# Patient Record
Sex: Male | Born: 1980 | Race: White | Hispanic: No | Marital: Single | Smoking: Never smoker
Health system: Southern US, Community
[De-identification: ages and names within clinical notes are randomized; demographics above are authoritative.]

---

## 2013-03-18 ENCOUNTER — Other Ambulatory Visit (HOSPITAL_COMMUNITY): Payer: Self-pay | Admitting: Chiropractor

## 2013-03-18 DIAGNOSIS — G540 Brachial plexus disorders: Secondary | ICD-10-CM

## 2013-03-20 ENCOUNTER — Other Ambulatory Visit: Payer: Self-pay | Admitting: Orthopedic Surgery

## 2013-03-20 ENCOUNTER — Other Ambulatory Visit (HOSPITAL_COMMUNITY): Payer: Self-pay | Admitting: Orthopedic Surgery

## 2013-03-20 DIAGNOSIS — M25512 Pain in left shoulder: Secondary | ICD-10-CM

## 2013-03-23 ENCOUNTER — Ambulatory Visit (HOSPITAL_COMMUNITY): Admission: RE | Admit: 2013-03-23 | Payer: Self-pay | Source: Ambulatory Visit

## 2013-03-29 ENCOUNTER — Other Ambulatory Visit: Payer: Self-pay

## 2013-04-04 ENCOUNTER — Ambulatory Visit
Admission: RE | Admit: 2013-04-04 | Discharge: 2013-04-04 | Disposition: A | Discharge status: Home / Self Care | Payer: BC Managed Care – PPO | Source: Ambulatory Visit | Attending: Orthopedic Surgery | Admitting: Orthopedic Surgery

## 2013-04-04 DIAGNOSIS — M25512 Pain in left shoulder: Secondary | ICD-10-CM

## 2016-04-30 ENCOUNTER — Other Ambulatory Visit: Payer: Self-pay | Admitting: Family Medicine

## 2016-04-30 ENCOUNTER — Ambulatory Visit
Admission: RE | Admit: 2016-04-30 | Discharge: 2016-04-30 | Disposition: A | Discharge status: Home / Self Care | Payer: No Typology Code available for payment source | Source: Ambulatory Visit | Attending: Family Medicine | Admitting: Family Medicine

## 2016-04-30 ENCOUNTER — Other Ambulatory Visit: Payer: Self-pay

## 2016-04-30 DIAGNOSIS — R1011 Right upper quadrant pain: Secondary | ICD-10-CM

## 2017-04-25 ENCOUNTER — Encounter (INDEPENDENT_AMBULATORY_CARE_PROVIDER_SITE_OTHER): Payer: Self-pay

## 2017-04-25 ENCOUNTER — Ambulatory Visit: Payer: Self-pay | Admitting: Neurology

## 2018-04-29 IMAGING — CT CT ABD-PELV W/O CM
2 of 4 series · 15 of 46 positions shown, 17 images · non-contrast
Comparison: None.

CLINICAL DATA: Acute onset of right flank pain and microhematuria.
Initial encounter.

EXAM:
CT ABDOMEN AND PELVIS WITHOUT CONTRAST
TECHNIQUE: Multidetector CT imaging of the abdomen and pelvis was performed
following the standard protocol without IV contrast.

[Series 2: renal standard/full · axial · 0.79mm/px · z∈[+756,+1261]mm · 12 of 116 slices shown, 14 images]
[im 10/116  soft-tissue]
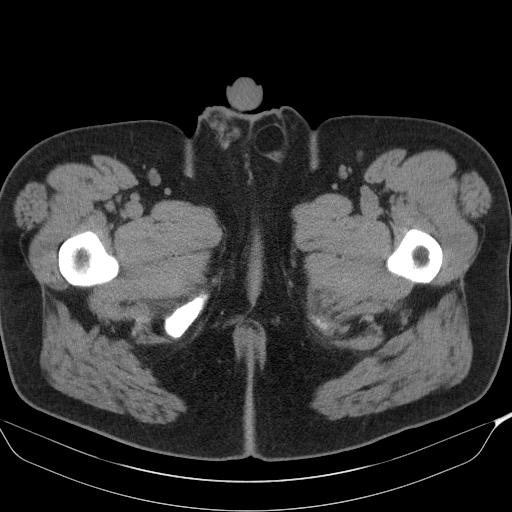
[im 10/116  bone]
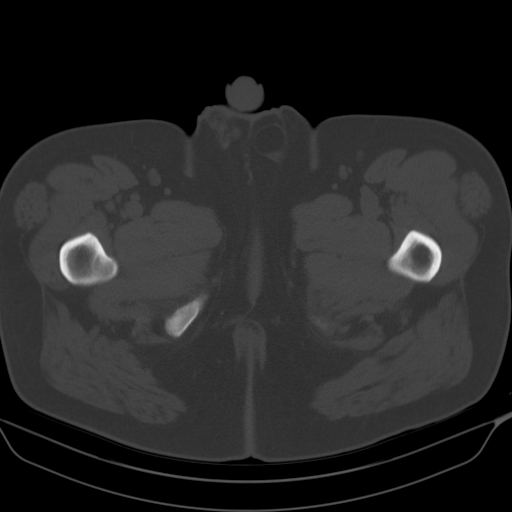
[im 19/116  soft-tissue]
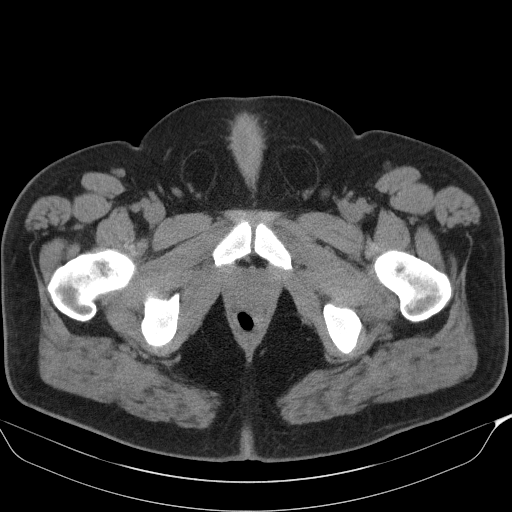
[im 28/116  soft-tissue]
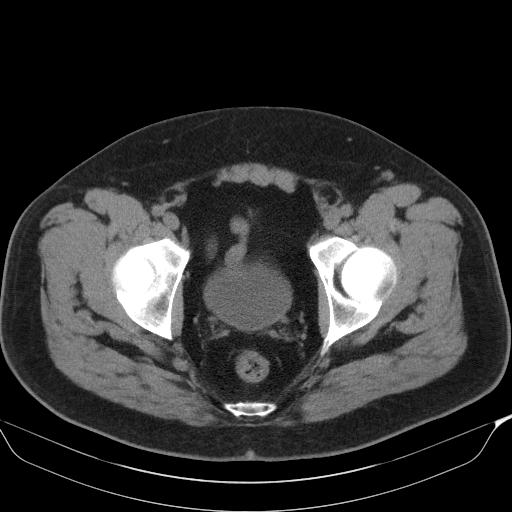
[im 37/116  soft-tissue]
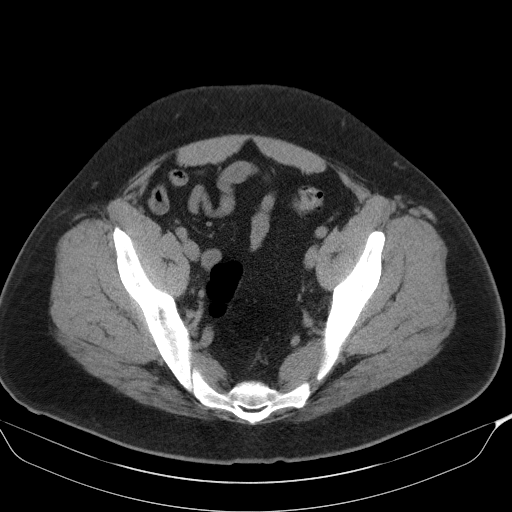
[im 47/116  soft-tissue]
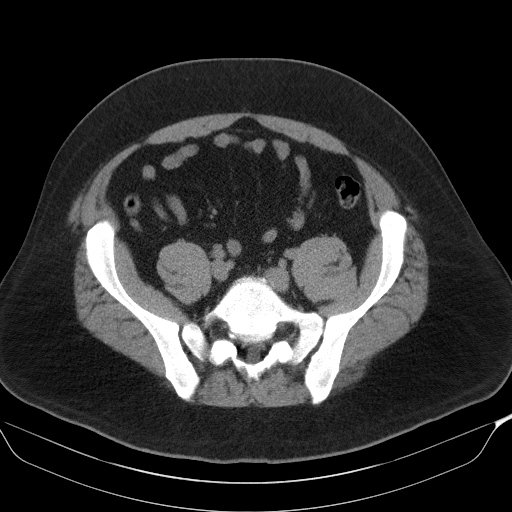
[im 56/116  soft-tissue]
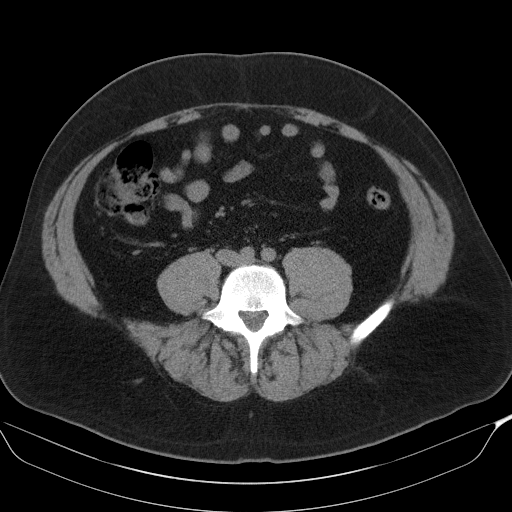
[im 65/116  soft-tissue]
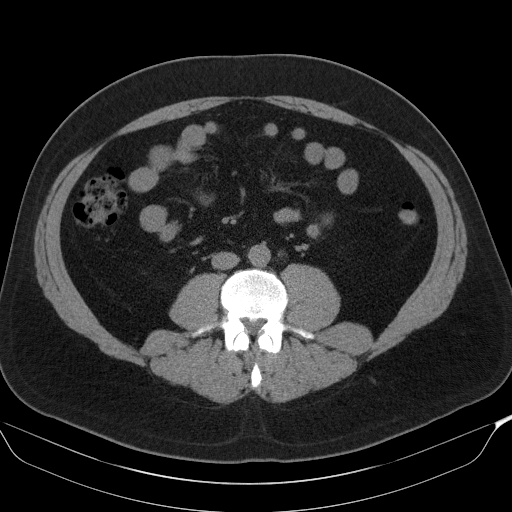
[im 74/116  soft-tissue]
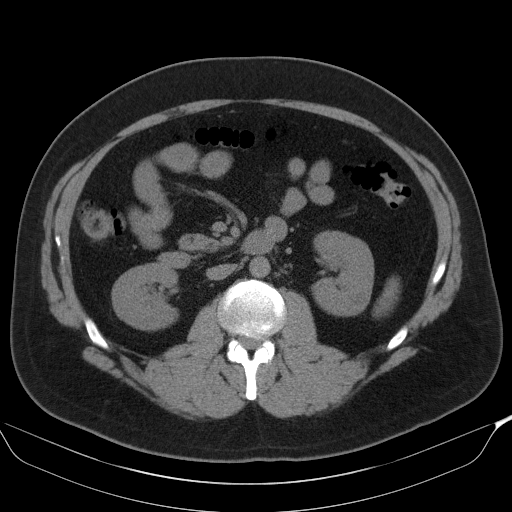
[im 83/116  soft-tissue]
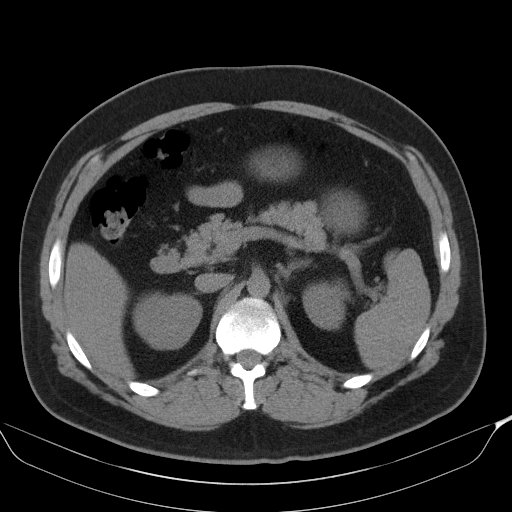
[im 83/116  bone]
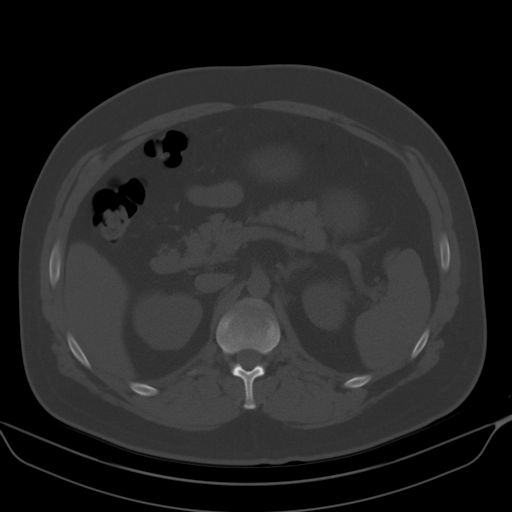
[im 93/116  soft-tissue]
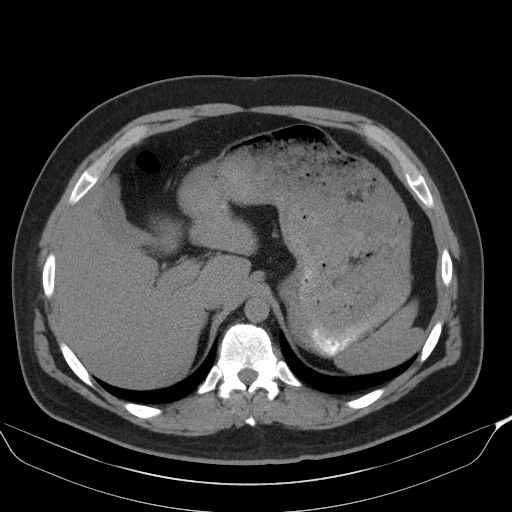
[im 102/116  soft-tissue]
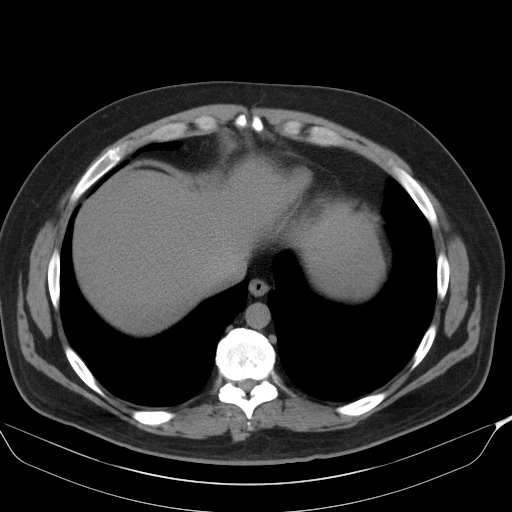
[im 111/116  soft-tissue]
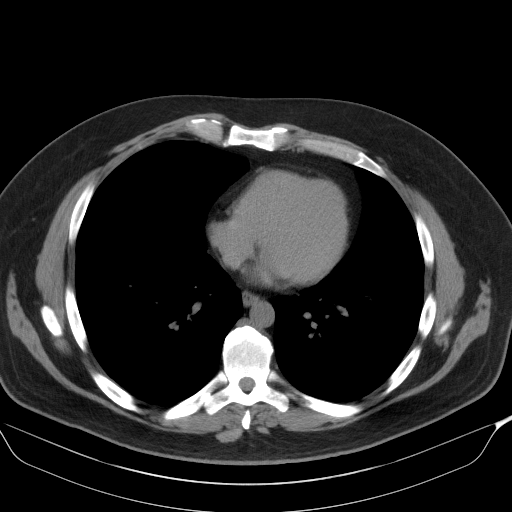

[Series 3: cor · coronal · 0.73mm/px · 3 of 104 slices shown]
[im 35/104  soft-tissue]
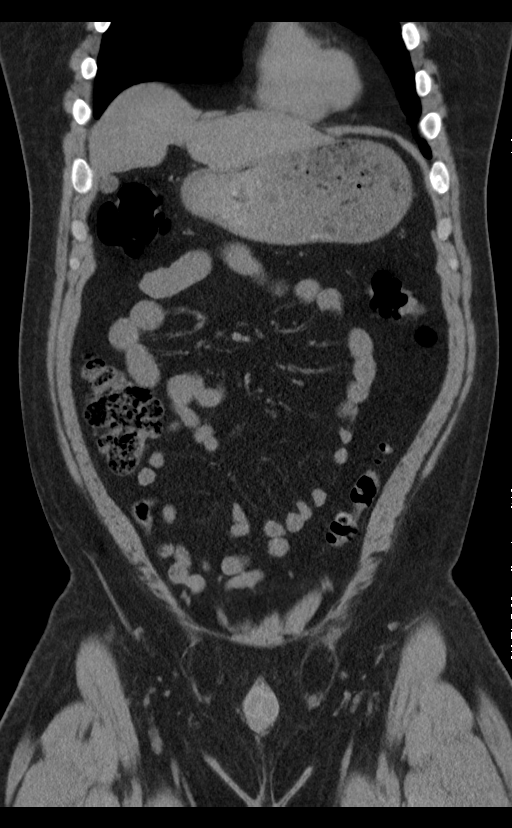
[im 46/104  soft-tissue]
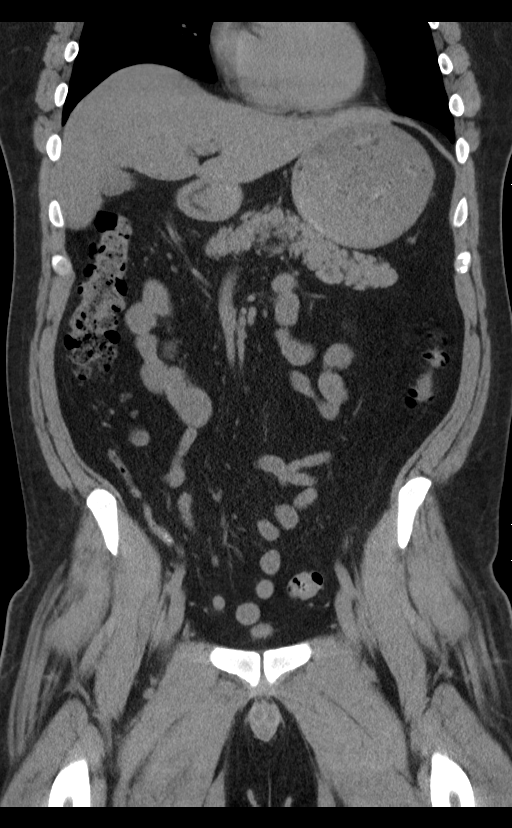
[im 58/104  soft-tissue]
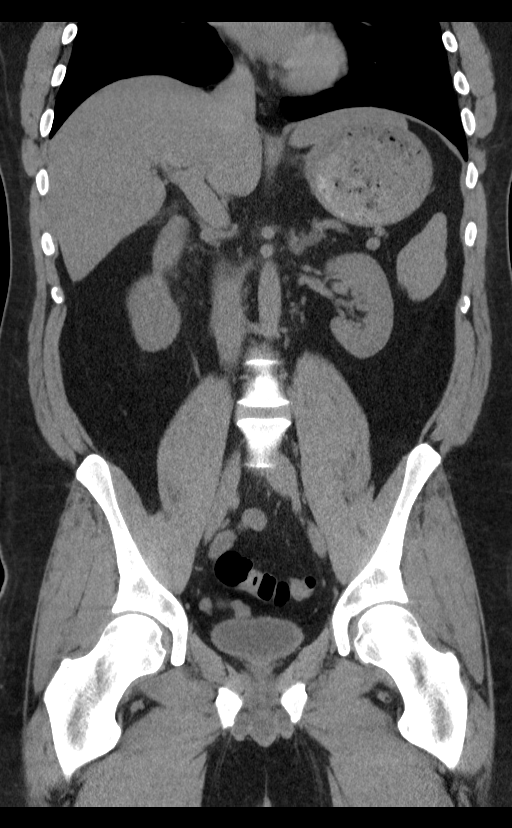

[15 of 46 positions shown; findings below may reference images not displayed]

FINDINGS: Lower chest: The visualized lung bases are grossly clear. The
visualized portions of the mediastinum are unremarkable.

Hepatobiliary: The liver is unremarkable in appearance. The
gallbladder is unremarkable in appearance. The common bile duct
remains normal in caliber.

Pancreas: The pancreas is within normal limits.

Spleen: The spleen is unremarkable in appearance.

Adrenals/Urinary Tract: The adrenal glands are unremarkable in
appearance.

Small left renal cysts are noted. The kidneys are otherwise
unremarkable. There is no evidence of hydronephrosis. No renal or
ureteral stones are identified. No perinephric stranding is seen.

Stomach/Bowel: The stomach is unremarkable in appearance. The small
bowel is within normal limits. The appendix is normal in caliber,
without evidence of appendicitis. The colon is unremarkable in
appearance.

Vascular/Lymphatic: The abdominal aorta is unremarkable in
appearance. The inferior vena cava is grossly unremarkable. No
retroperitoneal lymphadenopathy is seen. No pelvic sidewall
lymphadenopathy is identified.

Reproductive: The bladder is mildly distended and grossly
unremarkable. The prostate remains normal in size. Minimal
calcification is seen within the prostate.

Other: No additional soft tissue abnormalities are seen.

Musculoskeletal: No acute osseous abnormalities are identified. The
visualized musculature is unremarkable in appearance.
IMPRESSION: 1. No acute abnormality seen within the abdomen or pelvis.
2. Small left renal cysts noted.

These results were called by telephone at the time of interpretation
on 04/30/2016 at [DATE] to Dr. WAI SHEUNG ANGELY, who verbally
acknowledged these results.

## 2021-06-26 ENCOUNTER — Encounter (HOSPITAL_BASED_OUTPATIENT_CLINIC_OR_DEPARTMENT_OTHER): Payer: Self-pay

## 2021-06-26 ENCOUNTER — Emergency Department (HOSPITAL_BASED_OUTPATIENT_CLINIC_OR_DEPARTMENT_OTHER)
Admission: EM | Admit: 2021-06-26 | Discharge: 2021-06-26 | Disposition: A | Discharge status: Home / Self Care | Payer: 59 | Attending: Emergency Medicine | Admitting: Emergency Medicine

## 2021-06-26 ENCOUNTER — Other Ambulatory Visit: Payer: Self-pay

## 2021-06-26 ENCOUNTER — Emergency Department (HOSPITAL_BASED_OUTPATIENT_CLINIC_OR_DEPARTMENT_OTHER): Payer: 59

## 2021-06-26 DIAGNOSIS — R109 Unspecified abdominal pain: Secondary | ICD-10-CM | POA: Diagnosis present

## 2021-06-26 DIAGNOSIS — N201 Calculus of ureter: Secondary | ICD-10-CM

## 2021-06-26 DIAGNOSIS — N132 Hydronephrosis with renal and ureteral calculous obstruction: Secondary | ICD-10-CM | POA: Insufficient documentation

## 2021-06-26 LAB — COMPREHENSIVE METABOLIC PANEL
ALT: 27 U/L (ref 0–44)
AST: 18 U/L (ref 15–41)
Albumin: 4.5 g/dL (ref 3.5–5.0)
Alkaline Phosphatase: 66 U/L (ref 38–126)
Anion gap: 8 (ref 5–15)
BUN: 9 mg/dL (ref 6–20)
CO2: 28 mmol/L (ref 22–32)
Calcium: 9.6 mg/dL (ref 8.9–10.3)
Chloride: 103 mmol/L (ref 98–111)
Creatinine, Ser: 1.18 mg/dL (ref 0.61–1.24)
GFR, Estimated: >60 mL/min (ref >60)
Glucose, Bld: 147 mg/dL — ABNORMAL HIGH (ref 70–99)
Potassium: 4.4 mmol/L (ref 3.5–5.1)
Sodium: 139 mmol/L (ref 135–145)
Total Bilirubin: 0.4 mg/dL (ref 0.3–1.2)
Total Protein: 7.7 g/dL (ref 6.5–8.1)

## 2021-06-26 LAB — URINALYSIS, ROUTINE W REFLEX MICROSCOPIC
Bilirubin Urine: NEGATIVE
Glucose, UA: NEGATIVE mg/dL
Ketones, ur: NEGATIVE mg/dL
Leukocytes,Ua: NEGATIVE
Nitrite: NEGATIVE
Protein, ur: 30 mg/dL — AB
Specific Gravity, Urine: 1.027 (ref 1.005–1.030)
pH: 5.5 (ref 5.0–8.0)

## 2021-06-26 LAB — CBC
HCT: 49.4 % (ref 39.0–52.0)
Hemoglobin: 16.8 g/dL (ref 13.0–17.0)
MCH: 28.2 pg (ref 26.0–34.0)
MCHC: 34 g/dL (ref 30.0–36.0)
MCV: 83 fL (ref 80.0–100.0)
Platelets: 312 10*3/uL (ref 150–400)
RBC: 5.95 MIL/uL — ABNORMAL HIGH (ref 4.22–5.81)
RDW: 13.3 % (ref 11.5–15.5)
WBC: 12.5 10*3/uL — ABNORMAL HIGH (ref 4.0–10.5)
nRBC: 0 % (ref 0.0–0.2)

## 2021-06-26 LAB — LIPASE, BLOOD: Lipase: 40 U/L (ref 11–51)

## 2021-06-26 MED ORDER — OXYCODONE-ACETAMINOPHEN 5-325 MG PO TABS: 1.0000 | ORAL_TABLET | Freq: Four times a day (QID) | ORAL | 0 refills | Status: AC | PRN

## 2021-06-26 MED ORDER — SENNOSIDES-DOCUSATE SODIUM 8.6-50 MG PO TABS
1.0000 | ORAL_TABLET | Freq: Every evening | ORAL | 0 refills | Status: AC | PRN
Start: 2021-06-26 — End: ?

## 2021-06-26 MED ORDER — ONDANSETRON 4 MG PO TBDP: 4.0000 mg | ORAL_TABLET | Freq: Three times a day (TID) | ORAL | 0 refills | Status: AC | PRN

## 2021-06-26 MED ORDER — ONDANSETRON HCL 4 MG/2ML IJ SOLN
4.0000 mg | Freq: Once | INTRAMUSCULAR | Status: AC
  Administered 2021-06-26: 4 mg via INTRAVENOUS
  Filled 2021-06-26: qty 2

## 2021-06-26 MED ORDER — MORPHINE SULFATE (PF) 4 MG/ML IV SOLN
4.0000 mg | Freq: Once | INTRAVENOUS | Status: AC
  Administered 2021-06-26: 4 mg via INTRAVENOUS
  Filled 2021-06-26: qty 1

## 2021-06-26 MED ORDER — KETOROLAC TROMETHAMINE 30 MG/ML IJ SOLN
30.0000 mg | Freq: Once | INTRAMUSCULAR | Status: AC
  Administered 2021-06-26: 30 mg via INTRAVENOUS
  Filled 2021-06-26: qty 1

## 2021-06-26 MED ORDER — IBUPROFEN 800 MG PO TABS: 800.0000 mg | ORAL_TABLET | Freq: Three times a day (TID) | ORAL | 0 refills | Status: AC | PRN

## 2021-06-26 MED ORDER — TAMSULOSIN HCL 0.4 MG PO CAPS: 0.4000 mg | ORAL_CAPSULE | Freq: Every day | ORAL | 0 refills | Status: AC

## 2021-06-26 NOTE — ED Notes (Signed)
Lab to add culture on urine sample,

## 2021-06-26 NOTE — ED Triage Notes (Signed)
Patient here POV from Home with ABD Pain.  Patient states Pain began as Right Flank Pain and has now since transitioned into Mid ABD Pain.  Difficulty Urinating at Times. No Fevers. No N/V/D.  Patient uncomfortable during Triage. A&Ox4. GCS 15. Ambulatory.

## 2021-06-26 NOTE — ED Provider Notes (Signed)
Emergency Department Provider Note   I have reviewed the triage vital signs and the nursing notes.   HISTORY  Chief Complaint Abdominal Pain   HPI Brian Patton is a 41 y.o. male with prior history of kidney stone presents to the emergency department with left flank and mid abdominal pain.  Patient has noticed blood in the urine and states the pain feels very similar to his prior kidney stone.  With his prior stone, he did not require urology evaluation or treatment.  Symptoms began yesterday.  He denies any fevers or chills.  No nausea, vomiting, diarrhea.  He does have some difficulty with urination which was also the case with his prior stone.  No other modifying factors.  Pain is severe.   History reviewed. No pertinent past medical history.  Review of Systems  Constitutional: No fever/chills Eyes: No visual changes. ENT: No sore throat. Cardiovascular: Denies chest pain. Respiratory: Denies shortness of breath. Gastrointestinal: Positive flank and left mid-abdominal pain.  No nausea, no vomiting.  No diarrhea.  No constipation. Genitourinary: Negative for dysuria. Difficulty with urination and hematuria noted by patient.  Musculoskeletal: Negative for back pain. Skin: Negative for rash. Neurological: Negative for headaches, focal weakness or numbness.  10-point ROS otherwise negative.  ____________________________________________   PHYSICAL EXAM:  VITAL SIGNS: ED Triage Vitals  Enc Vitals Group     BP 06/26/21 1146 (!) 144/89     Pulse Rate 06/26/21 1146 94     Resp 06/26/21 1146 18     Temp 06/26/21 1146 98.1 F (36.7 C)     Temp src --      SpO2 06/26/21 1146 99 %     Weight 06/26/21 1147 242 lb (109.8 kg)     Height 06/26/21 1147 6\' 3"  (1.905 m)    Constitutional: Alert and oriented. Appears uncomfortable with frequent shifting in bed.  Eyes: Conjunctivae are normal.  Head: Atraumatic. Nose: No congestion/rhinnorhea. Mouth/Throat: Mucous  membranes are moist.  Neck: No stridor.  Cardiovascular: Normal rate, regular rhythm. Good peripheral circulation. Grossly normal heart sounds.   Respiratory: Normal respiratory effort.  No retractions. Lungs CTAB. Gastrointestinal: Soft and nontender. No distention.  Musculoskeletal: No lower extremity tenderness nor edema. No gross deformities of extremities. Neurologic:  Normal speech and language. No gross focal neurologic deficits are appreciated.  Skin:  Skin is warm, dry and intact. No rash noted.  ____________________________________________   LABS (all labs ordered are listed, but only abnormal results are displayed)  Labs Reviewed  URINE CULTURE - Abnormal; Notable for the following components:      Result Value   Culture   (*)    Value: <10,000 COLONIES/mL INSIGNIFICANT GROWTH Performed at [facility], [place] [address] [place], [place] [address]    All other components within normal limits  COMPREHENSIVE METABOLIC PANEL - Abnormal; Notable for the following components:   Glucose, Bld 147 (*)    All other components within normal limits  CBC - Abnormal; Notable for the following components:   WBC 12.5 (*)    RBC 5.95 (*)    All other components within normal limits  URINALYSIS, ROUTINE W REFLEX MICROSCOPIC - Abnormal; Notable for the following components:   Hgb urine dipstick LARGE (*)    Protein, ur 30 (*)    All other components within normal limits  LIPASE, BLOOD   ____________________________________________  RADIOLOGY  CT renal independently evaluated. Left obstructing stone with hydro noted.   ____________________________________________   PROCEDURES  Procedure(s) performed:   Procedures  None  ____________________________________________   INITIAL IMPRESSION / ASSESSMENT AND PLAN / ED COURSE  Pertinent labs & imaging results that were available during my care of the patient were reviewed by me and considered in my medical decision making  (see chart for details).   This patient is Presenting for Evaluation of abdominal pain, which does require a range of treatment options, and is a complaint that involves a high risk of morbidity and mortality.  The Differential Diagnoses includes but is not exclusive to acute appendicitis, renal colic, testicular torsion, urinary tract infection, prostatitis,  diverticulitis, small bowel obstruction, colitis, abdominal aortic aneurysm, gastroenteritis, constipation etc.   Critical Interventions- pain mgmt and labs with UA and CT renal stone study ordered.    Reassessment after intervention: Patient more comfortable on re-evaluation. Vitals remain WNL.    I did Additional Historical Information from mother at bedside who confirms history and timeline outlined above.   I decided to review pertinent External Data, and in summary patient followed in 2020 for cervical radiculopathy in the [facility] but not since, now lower back symptoms.    Clinical Laboratory Tests Ordered, included CMP, Lipase, CBC, and UA w/ culture. No evidence of UTI. No AKI. Hematuria on UA. Will send for Cx. LFTs and bilirubin are WNL.   Radiologic Tests Ordered, included CT renal which I independently evaluated. Ureteral stone with hydro noted which correlates with patient's symptoms.   Cardiac Monitor Tracing which shows NSR   Reevaluation with update and discussion with patient. CT renal confirming stone. Patient more comfortable. No fever.   Medical Decision Making: Summary:  Patient's presentation is most consistent with ureteral stone. No evidence of large or infected stone to prompt immediate Urology consultation. Provided contact information for local Urology practice for follow up. Pain medication and flomax prescribed after review of the [place] drug database in compliance with the STOP act. Discussed strict ED return precautions.   Disposition: Discharge.    ____________________________________________  FINAL CLINICAL IMPRESSION(S) / ED DIAGNOSES  Final diagnoses:  Left ureteral stone     MEDICATIONS GIVEN DURING THIS VISIT:  Medications  morphine 4 MG/ML injection 4 mg (4 mg Intravenous Given 06/26/21 1310)  ondansetron (ZOFRAN) injection 4 mg (4 mg Intravenous Given 06/26/21 1309)  ketorolac (TORADOL) 30 MG/ML injection 30 mg (30 mg Intravenous Given 06/26/21 1309)     NEW OUTPATIENT MEDICATIONS STARTED DURING THIS VISIT:  Discharge Medication List as of 06/26/2021  1:33 PM     START taking these medications   Details  ibuprofen (ADVIL) 800 MG tablet Take 1 tablet (800 mg total) by mouth every 8 (eight) hours as needed for moderate pain., Starting Mon 06/26/2021, Normal    ondansetron (ZOFRAN-ODT) 4 MG disintegrating tablet Take 1 tablet (4 mg total) by mouth every 8 (eight) hours as needed for nausea or vomiting., Starting Mon 06/26/2021, Normal    oxyCODONE-acetaminophen (PERCOCET/ROXICET) 5-325 MG tablet Take 1 tablet by mouth every 6 (six) hours as needed for severe pain., Starting Mon 06/26/2021, Normal    senna-docusate (SENOKOT-S) 8.6-50 MG tablet Take 1 tablet by mouth at bedtime as needed for mild constipation or moderate constipation., Starting Mon 06/26/2021, Normal    tamsulosin (FLOMAX) 0.4 MG CAPS capsule Take 1 capsule (0.4 mg total) by mouth daily., Starting Mon 06/26/2021, Normal        Note:  This document was prepared using Dragon voice recognition software and may include unintentional dictation errors.  Nanda Quinton,  MD, [facility]    Callum Wolf, [facility], MD 06/28/21 1640

## 2021-06-26 NOTE — Discharge Instructions (Signed)

## 2021-06-27 LAB — URINE CULTURE: Culture: <10000 — AB

## 2023-12-21 IMAGING — CT CT RENAL STONE PROTOCOL
2 of 4 series · 15 of 46 positions shown, 17 images · non-contrast
Comparison: CT abdomen and pelvis dated April 30, 2016
COMPARISON: CT abdomen and pelvis dated April 30, 2016

Addendum:
CLINICAL DATA: Left flank pain

EXAM:
CT ABDOMEN AND PELVIS WITHOUT CONTRAST
TECHNIQUE: Multidetector CT imaging of the abdomen and pelvis was performed
following the standard protocol without IV contrast.

[Series 2: stone full · axial · 0.77mm/px · z∈[-540,-70]mm · 12 of 108 slices shown, 14 images]
[im 9/108  soft-tissue]
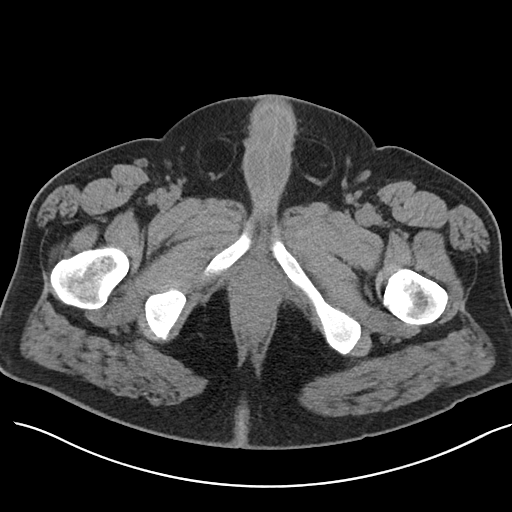
[im 9/108  bone]
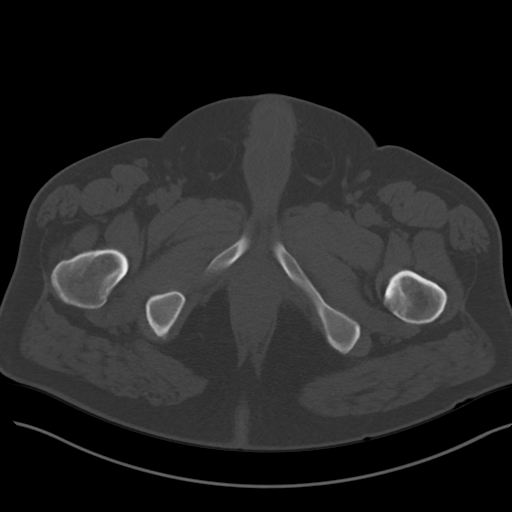
[im 18/108  soft-tissue]
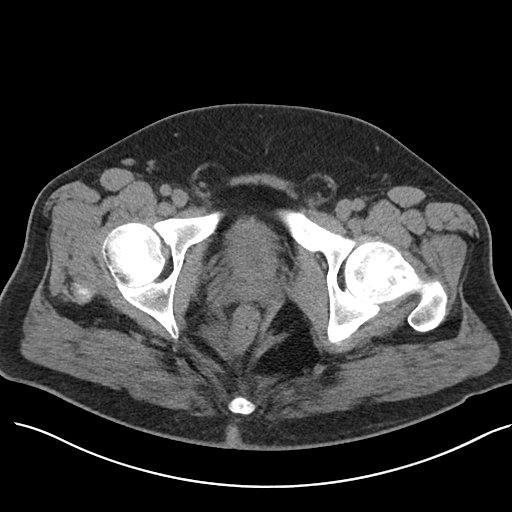
[im 26/108  soft-tissue]
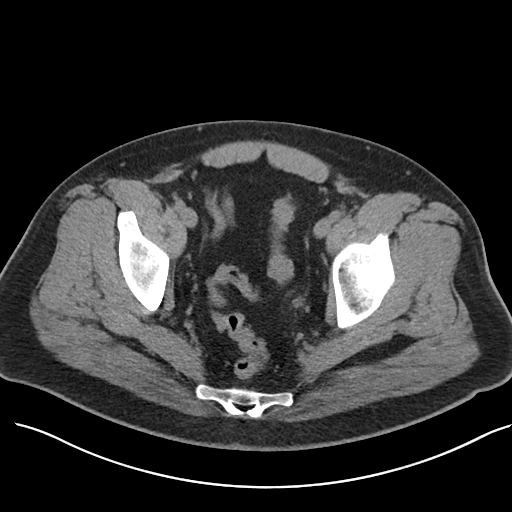
[im 35/108  soft-tissue]
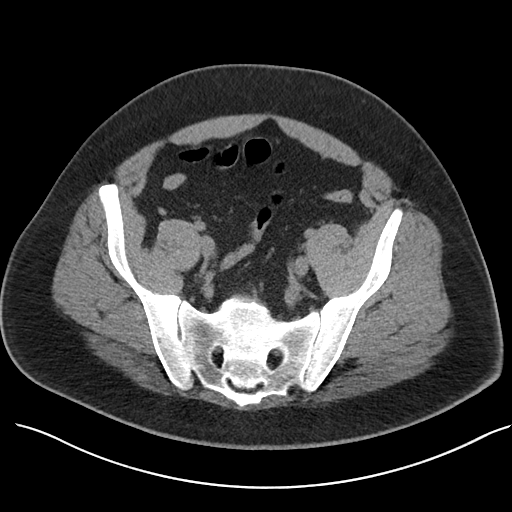
[im 43/108  soft-tissue]
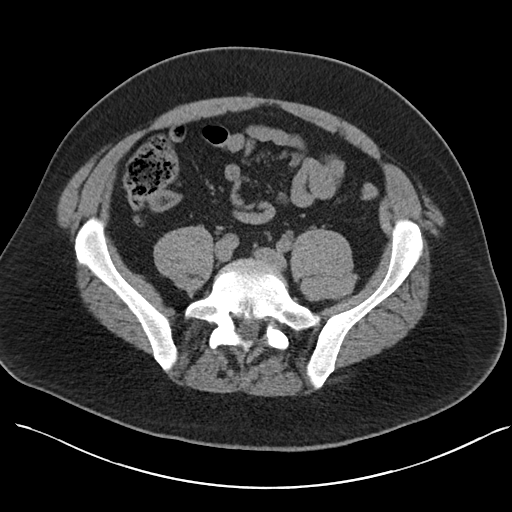
[im 52/108  soft-tissue]
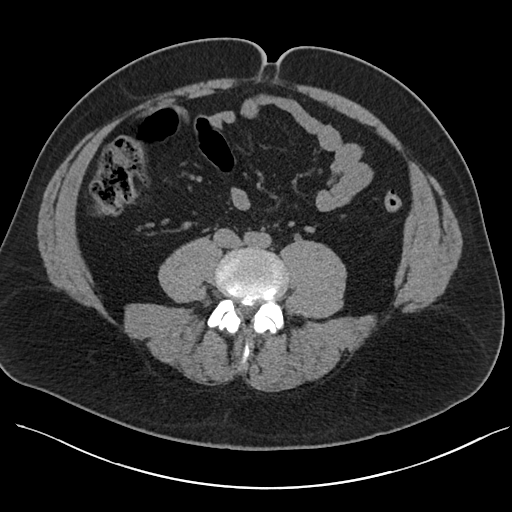
[im 60/108  soft-tissue]
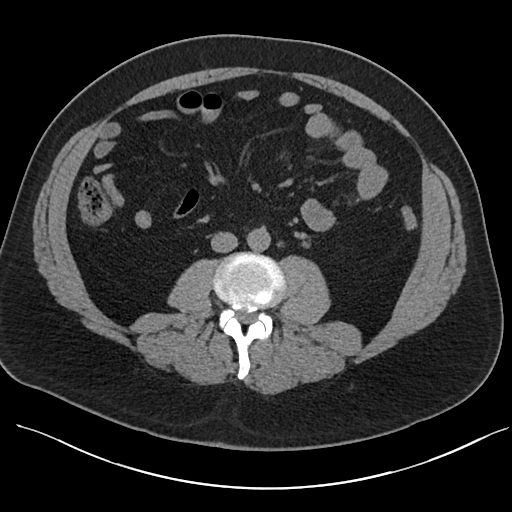
[im 69/108  soft-tissue]
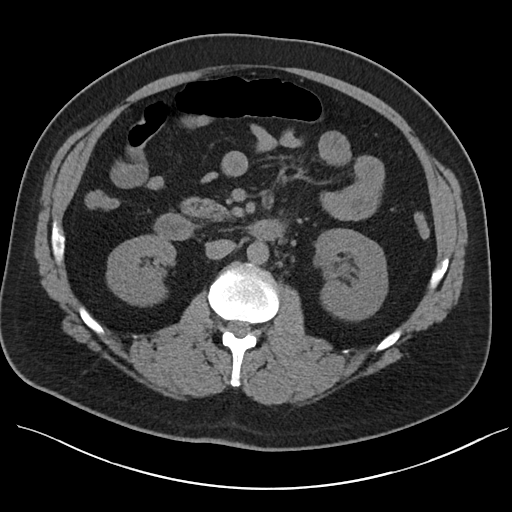
[im 78/108  soft-tissue]
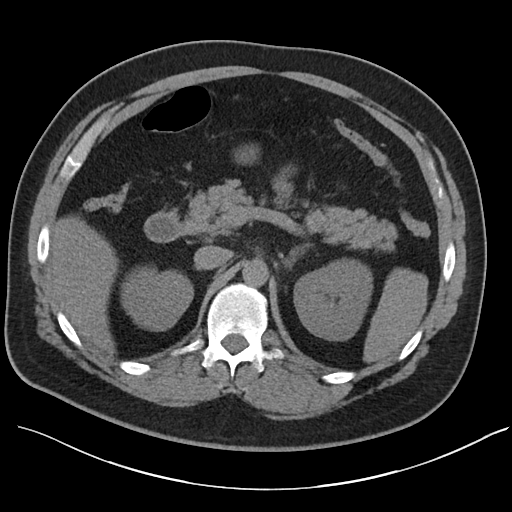
[im 78/108  bone]
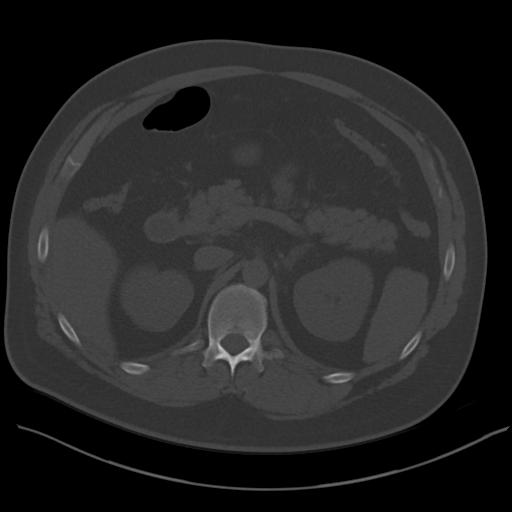
[im 86/108  soft-tissue]
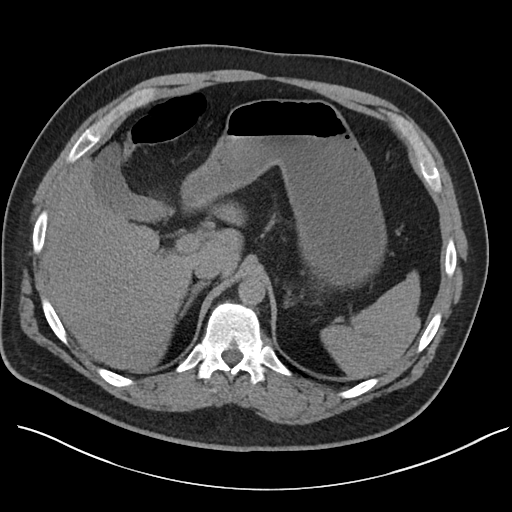
[im 95/108  soft-tissue]
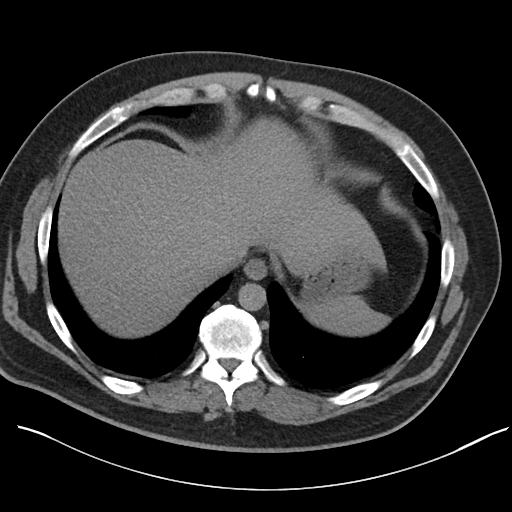
[im 103/108  soft-tissue]
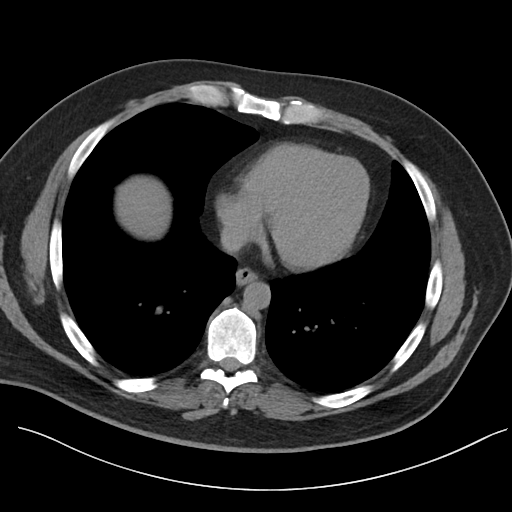

[Series 5: coronal · coronal · 0.84mm/px · 3 of 109 slices shown]
[im 37/109  soft-tissue]
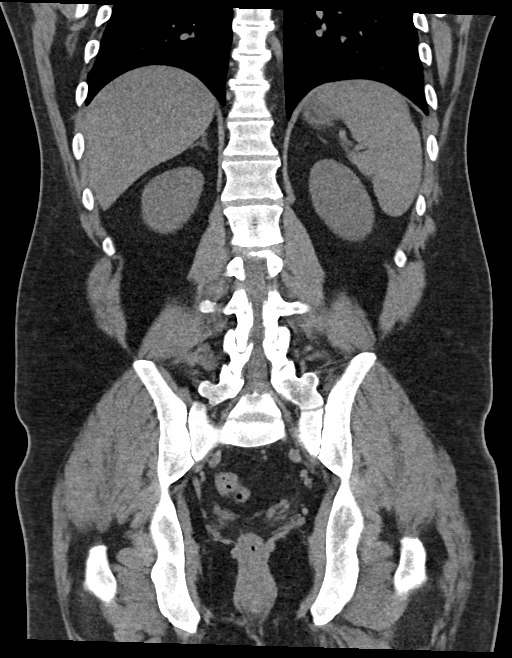
[im 49/109  soft-tissue]
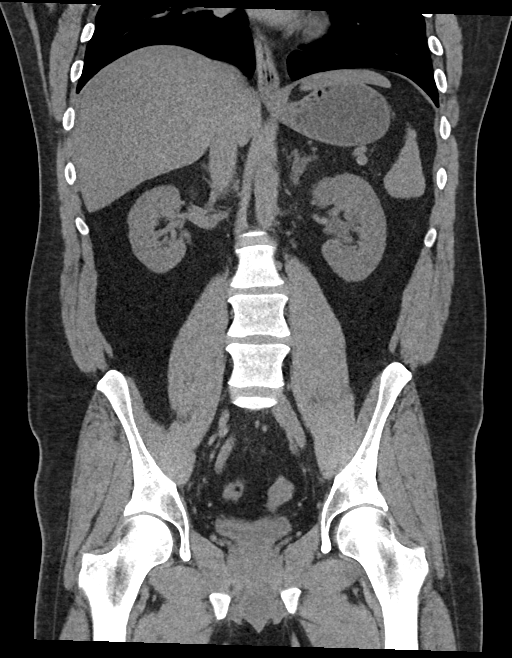
[im 61/109  soft-tissue]
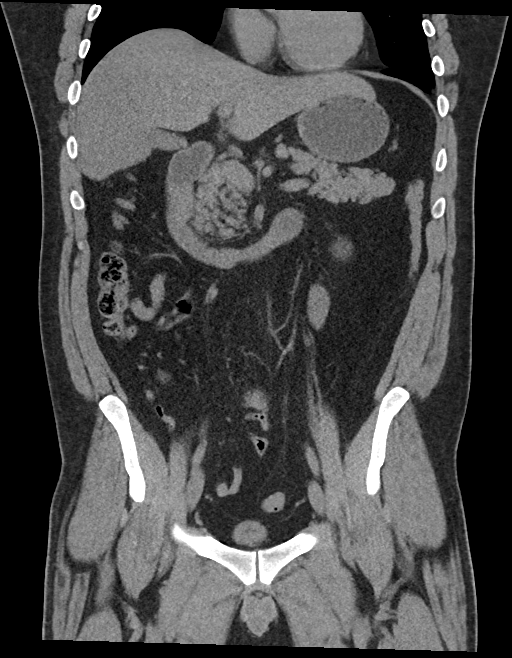

[15 of 46 positions shown; findings below may reference images not displayed]

FINDINGS: Lower chest: No acute abnormality.

Hepatobiliary: No focal liver abnormality is seen. No gallstones,
gallbladder wall thickening, or biliary dilatation.

Pancreas: Unremarkable. No pancreatic ductal dilatation or
surrounding inflammatory changes.

Spleen: Normal in size without focal abnormality.

Adrenals/Urinary Tract: No hydronephrosis or nephrolithiasis. Tiny
punctate stone of the left ureterovesical junction measuring 2 mm.
Mild left hydronephrosis and mildly dilated left ureter with some
adjacent inflammatory stranding. Low-attenuation lesions of the left
kidney which are likely simple cysts. Right kidney is unremarkable.

Stomach/Bowel: Stomach is within normal limits. Appendix appears
normal. No evidence of bowel wall thickening, distention, or
inflammatory changes.

Vascular/Lymphatic: No significant vascular findings are present. No
enlarged abdominal or pelvic lymph nodes.

Reproductive: Prostate is unremarkable.

Other: Small bilateral fat containing inguinal hernias.

Musculoskeletal: No acute or significant osseous findings.
IMPRESSION: Tiny punctate stone of the left ureterovesical junction measuring 2
mm. Mild left hydronephrosis and mildly dilated left ureter with
some adjacent inflammatory stranding.

ADDENDUM:
No follow-up is required for simple renal cysts, the cysts are also
present on 9470 prior and are unchanged.

*** End of Addendum ***
FINDINGS: Lower chest: No acute abnormality.

Hepatobiliary: No focal liver abnormality is seen. No gallstones,
gallbladder wall thickening, or biliary dilatation.

Pancreas: Unremarkable. No pancreatic ductal dilatation or
surrounding inflammatory changes.

Spleen: Normal in size without focal abnormality.

Adrenals/Urinary Tract: No hydronephrosis or nephrolithiasis. Tiny
punctate stone of the left ureterovesical junction measuring 2 mm.
Mild left hydronephrosis and mildly dilated left ureter with some
adjacent inflammatory stranding. Low-attenuation lesions of the left
kidney which are likely simple cysts. Right kidney is unremarkable.

Stomach/Bowel: Stomach is within normal limits. Appendix appears
normal. No evidence of bowel wall thickening, distention, or
inflammatory changes.

Vascular/Lymphatic: No significant vascular findings are present. No
enlarged abdominal or pelvic lymph nodes.

Reproductive: Prostate is unremarkable.

Other: Small bilateral fat containing inguinal hernias.

Musculoskeletal: No acute or significant osseous findings.
IMPRESSION: Tiny punctate stone of the left ureterovesical junction measuring 2
mm. Mild left hydronephrosis and mildly dilated left ureter with
some adjacent inflammatory stranding.
